# Patient Record
Sex: Female | Born: 2000 | Race: White | Hispanic: No | Marital: Single | Smoking: Never smoker
Health system: Southern US, Community
[De-identification: ages and names within clinical notes are randomized; demographics above are authoritative.]

---
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
## 2021-06-26 DIAGNOSIS — M62522 Muscle wasting and atrophy, not elsewhere classified, left upper arm: Secondary | ICD-10-CM | POA: Diagnosis not present

## 2021-06-26 DIAGNOSIS — M25512 Pain in left shoulder: Secondary | ICD-10-CM | POA: Diagnosis not present

## 2021-06-26 DIAGNOSIS — M4003 Postural kyphosis, cervicothoracic region: Secondary | ICD-10-CM | POA: Diagnosis not present

## 2021-07-01 DIAGNOSIS — H6693 Otitis media, unspecified, bilateral: Secondary | ICD-10-CM | POA: Diagnosis not present

## 2021-07-01 DIAGNOSIS — J029 Acute pharyngitis, unspecified: Secondary | ICD-10-CM | POA: Diagnosis not present

## 2021-07-02 DIAGNOSIS — M62522 Muscle wasting and atrophy, not elsewhere classified, left upper arm: Secondary | ICD-10-CM | POA: Diagnosis not present

## 2021-07-02 DIAGNOSIS — M4003 Postural kyphosis, cervicothoracic region: Secondary | ICD-10-CM | POA: Diagnosis not present

## 2021-07-02 DIAGNOSIS — M25512 Pain in left shoulder: Secondary | ICD-10-CM | POA: Diagnosis not present

## 2021-07-04 DIAGNOSIS — M62522 Muscle wasting and atrophy, not elsewhere classified, left upper arm: Secondary | ICD-10-CM | POA: Diagnosis not present

## 2021-07-04 DIAGNOSIS — M25512 Pain in left shoulder: Secondary | ICD-10-CM | POA: Diagnosis not present

## 2021-07-04 DIAGNOSIS — M4003 Postural kyphosis, cervicothoracic region: Secondary | ICD-10-CM | POA: Diagnosis not present

## 2021-07-09 DIAGNOSIS — M62522 Muscle wasting and atrophy, not elsewhere classified, left upper arm: Secondary | ICD-10-CM | POA: Diagnosis not present

## 2021-07-09 DIAGNOSIS — M4003 Postural kyphosis, cervicothoracic region: Secondary | ICD-10-CM | POA: Diagnosis not present

## 2021-07-09 DIAGNOSIS — M25512 Pain in left shoulder: Secondary | ICD-10-CM | POA: Diagnosis not present

## 2021-07-19 DIAGNOSIS — M25512 Pain in left shoulder: Secondary | ICD-10-CM | POA: Diagnosis not present

## 2021-07-19 DIAGNOSIS — M62522 Muscle wasting and atrophy, not elsewhere classified, left upper arm: Secondary | ICD-10-CM | POA: Diagnosis not present

## 2021-07-19 DIAGNOSIS — M4003 Postural kyphosis, cervicothoracic region: Secondary | ICD-10-CM | POA: Diagnosis not present

## 2021-07-30 DIAGNOSIS — M62522 Muscle wasting and atrophy, not elsewhere classified, left upper arm: Secondary | ICD-10-CM | POA: Diagnosis not present

## 2021-07-30 DIAGNOSIS — M25512 Pain in left shoulder: Secondary | ICD-10-CM | POA: Diagnosis not present

## 2021-07-30 DIAGNOSIS — M4003 Postural kyphosis, cervicothoracic region: Secondary | ICD-10-CM | POA: Diagnosis not present

## 2021-08-02 DIAGNOSIS — M25512 Pain in left shoulder: Secondary | ICD-10-CM | POA: Diagnosis not present

## 2021-08-02 DIAGNOSIS — M62522 Muscle wasting and atrophy, not elsewhere classified, left upper arm: Secondary | ICD-10-CM | POA: Diagnosis not present

## 2021-08-02 DIAGNOSIS — M4003 Postural kyphosis, cervicothoracic region: Secondary | ICD-10-CM | POA: Diagnosis not present

## 2021-08-06 DIAGNOSIS — M4003 Postural kyphosis, cervicothoracic region: Secondary | ICD-10-CM | POA: Diagnosis not present

## 2021-08-06 DIAGNOSIS — M25512 Pain in left shoulder: Secondary | ICD-10-CM | POA: Diagnosis not present

## 2021-08-06 DIAGNOSIS — M62522 Muscle wasting and atrophy, not elsewhere classified, left upper arm: Secondary | ICD-10-CM | POA: Diagnosis not present

## 2021-08-08 DIAGNOSIS — M62522 Muscle wasting and atrophy, not elsewhere classified, left upper arm: Secondary | ICD-10-CM | POA: Diagnosis not present

## 2021-08-08 DIAGNOSIS — M25512 Pain in left shoulder: Secondary | ICD-10-CM | POA: Diagnosis not present

## 2021-08-08 DIAGNOSIS — M4003 Postural kyphosis, cervicothoracic region: Secondary | ICD-10-CM | POA: Diagnosis not present

## 2021-08-13 DIAGNOSIS — M62522 Muscle wasting and atrophy, not elsewhere classified, left upper arm: Secondary | ICD-10-CM | POA: Diagnosis not present

## 2021-08-13 DIAGNOSIS — M4003 Postural kyphosis, cervicothoracic region: Secondary | ICD-10-CM | POA: Diagnosis not present

## 2021-08-13 DIAGNOSIS — M25512 Pain in left shoulder: Secondary | ICD-10-CM | POA: Diagnosis not present

## 2021-08-15 DIAGNOSIS — M62522 Muscle wasting and atrophy, not elsewhere classified, left upper arm: Secondary | ICD-10-CM | POA: Diagnosis not present

## 2021-08-15 DIAGNOSIS — M4003 Postural kyphosis, cervicothoracic region: Secondary | ICD-10-CM | POA: Diagnosis not present

## 2021-08-15 DIAGNOSIS — M25512 Pain in left shoulder: Secondary | ICD-10-CM | POA: Diagnosis not present

## 2021-08-19 DIAGNOSIS — M25512 Pain in left shoulder: Secondary | ICD-10-CM | POA: Diagnosis not present

## 2021-08-26 DIAGNOSIS — M25512 Pain in left shoulder: Secondary | ICD-10-CM | POA: Diagnosis not present

## 2021-09-20 DIAGNOSIS — N3090 Cystitis, unspecified without hematuria: Secondary | ICD-10-CM | POA: Diagnosis not present

## 2021-09-20 DIAGNOSIS — N760 Acute vaginitis: Secondary | ICD-10-CM | POA: Diagnosis not present

## 2021-09-24 DIAGNOSIS — M24112 Other articular cartilage disorders, left shoulder: Secondary | ICD-10-CM | POA: Diagnosis not present

## 2021-10-01 DIAGNOSIS — Z01818 Encounter for other preprocedural examination: Secondary | ICD-10-CM | POA: Diagnosis not present

## 2021-10-02 DIAGNOSIS — R509 Fever, unspecified: Secondary | ICD-10-CM | POA: Diagnosis not present

## 2021-10-02 DIAGNOSIS — M791 Myalgia, unspecified site: Secondary | ICD-10-CM | POA: Diagnosis not present

## 2021-10-30 DIAGNOSIS — Z113 Encounter for screening for infections with a predominantly sexual mode of transmission: Secondary | ICD-10-CM | POA: Diagnosis not present

## 2021-10-30 DIAGNOSIS — R102 Pelvic and perineal pain: Secondary | ICD-10-CM | POA: Diagnosis not present

## 2021-10-30 DIAGNOSIS — N83299 Other ovarian cyst, unspecified side: Secondary | ICD-10-CM | POA: Diagnosis not present

## 2021-11-13 DIAGNOSIS — N83202 Unspecified ovarian cyst, left side: Secondary | ICD-10-CM | POA: Diagnosis not present

## 2021-11-13 DIAGNOSIS — R102 Pelvic and perineal pain: Secondary | ICD-10-CM | POA: Diagnosis not present

## 2021-11-13 DIAGNOSIS — R1032 Left lower quadrant pain: Secondary | ICD-10-CM | POA: Diagnosis not present

## 2021-12-18 DIAGNOSIS — M24112 Other articular cartilage disorders, left shoulder: Secondary | ICD-10-CM | POA: Diagnosis not present

## 2021-12-18 DIAGNOSIS — M898X1 Other specified disorders of bone, shoulder: Secondary | ICD-10-CM | POA: Diagnosis not present

## 2021-12-18 DIAGNOSIS — J45909 Unspecified asthma, uncomplicated: Secondary | ICD-10-CM | POA: Diagnosis not present

## 2021-12-31 DIAGNOSIS — M25512 Pain in left shoulder: Secondary | ICD-10-CM | POA: Diagnosis not present

## 2021-12-31 DIAGNOSIS — M6281 Muscle weakness (generalized): Secondary | ICD-10-CM | POA: Diagnosis not present

## 2022-01-03 DIAGNOSIS — M6281 Muscle weakness (generalized): Secondary | ICD-10-CM | POA: Diagnosis not present

## 2022-01-03 DIAGNOSIS — M25512 Pain in left shoulder: Secondary | ICD-10-CM | POA: Diagnosis not present

## 2022-01-06 DIAGNOSIS — M25512 Pain in left shoulder: Secondary | ICD-10-CM | POA: Diagnosis not present

## 2022-01-06 DIAGNOSIS — M6281 Muscle weakness (generalized): Secondary | ICD-10-CM | POA: Diagnosis not present

## 2022-01-08 DIAGNOSIS — M25512 Pain in left shoulder: Secondary | ICD-10-CM | POA: Diagnosis not present

## 2022-01-08 DIAGNOSIS — M6281 Muscle weakness (generalized): Secondary | ICD-10-CM | POA: Diagnosis not present

## 2022-01-15 DIAGNOSIS — M25512 Pain in left shoulder: Secondary | ICD-10-CM | POA: Diagnosis not present

## 2022-01-15 DIAGNOSIS — M6281 Muscle weakness (generalized): Secondary | ICD-10-CM | POA: Diagnosis not present

## 2022-01-28 DIAGNOSIS — M25512 Pain in left shoulder: Secondary | ICD-10-CM | POA: Diagnosis not present

## 2022-01-28 DIAGNOSIS — M6281 Muscle weakness (generalized): Secondary | ICD-10-CM | POA: Diagnosis not present

## 2022-01-30 DIAGNOSIS — M6281 Muscle weakness (generalized): Secondary | ICD-10-CM | POA: Diagnosis not present

## 2022-01-30 DIAGNOSIS — M25512 Pain in left shoulder: Secondary | ICD-10-CM | POA: Diagnosis not present

## 2022-02-04 DIAGNOSIS — M6281 Muscle weakness (generalized): Secondary | ICD-10-CM | POA: Diagnosis not present

## 2022-02-04 DIAGNOSIS — M25512 Pain in left shoulder: Secondary | ICD-10-CM | POA: Diagnosis not present

## 2022-02-14 DIAGNOSIS — Z20822 Contact with and (suspected) exposure to covid-19: Secondary | ICD-10-CM | POA: Diagnosis not present

## 2022-02-14 DIAGNOSIS — J0100 Acute maxillary sinusitis, unspecified: Secondary | ICD-10-CM | POA: Diagnosis not present

## 2022-02-14 DIAGNOSIS — J45998 Other asthma: Secondary | ICD-10-CM | POA: Diagnosis not present

## 2022-03-06 DIAGNOSIS — H10413 Chronic giant papillary conjunctivitis, bilateral: Secondary | ICD-10-CM | POA: Diagnosis not present

## 2022-03-06 DIAGNOSIS — H5213 Myopia, bilateral: Secondary | ICD-10-CM | POA: Diagnosis not present

## 2022-03-18 DIAGNOSIS — M6281 Muscle weakness (generalized): Secondary | ICD-10-CM | POA: Diagnosis not present

## 2022-03-18 DIAGNOSIS — M25512 Pain in left shoulder: Secondary | ICD-10-CM | POA: Diagnosis not present

## 2022-03-20 DIAGNOSIS — M24112 Other articular cartilage disorders, left shoulder: Secondary | ICD-10-CM | POA: Diagnosis not present

## 2022-04-21 DIAGNOSIS — M24112 Other articular cartilage disorders, left shoulder: Secondary | ICD-10-CM | POA: Diagnosis not present

## 2022-05-12 DIAGNOSIS — Z4789 Encounter for other orthopedic aftercare: Secondary | ICD-10-CM | POA: Diagnosis not present

## 2022-05-12 DIAGNOSIS — M24112 Other articular cartilage disorders, left shoulder: Secondary | ICD-10-CM | POA: Diagnosis not present

## 2022-05-13 DIAGNOSIS — Z124 Encounter for screening for malignant neoplasm of cervix: Secondary | ICD-10-CM | POA: Diagnosis not present

## 2022-05-13 DIAGNOSIS — N760 Acute vaginitis: Secondary | ICD-10-CM | POA: Diagnosis not present

## 2022-05-13 DIAGNOSIS — Z01419 Encounter for gynecological examination (general) (routine) without abnormal findings: Secondary | ICD-10-CM | POA: Diagnosis not present

## 2022-05-13 DIAGNOSIS — Z113 Encounter for screening for infections with a predominantly sexual mode of transmission: Secondary | ICD-10-CM | POA: Diagnosis not present

## 2022-05-13 DIAGNOSIS — Z6821 Body mass index (BMI) 21.0-21.9, adult: Secondary | ICD-10-CM | POA: Diagnosis not present

## 2022-05-29 DIAGNOSIS — M24112 Other articular cartilage disorders, left shoulder: Secondary | ICD-10-CM | POA: Diagnosis not present

## 2022-06-05 DIAGNOSIS — R87612 Low grade squamous intraepithelial lesion on cytologic smear of cervix (LGSIL): Secondary | ICD-10-CM | POA: Diagnosis not present

## 2022-06-05 DIAGNOSIS — N72 Inflammatory disease of cervix uteri: Secondary | ICD-10-CM | POA: Diagnosis not present

## 2022-06-05 DIAGNOSIS — N871 Moderate cervical dysplasia: Secondary | ICD-10-CM | POA: Diagnosis not present

## 2022-06-05 DIAGNOSIS — N870 Mild cervical dysplasia: Secondary | ICD-10-CM | POA: Diagnosis not present

## 2022-07-01 DIAGNOSIS — N939 Abnormal uterine and vaginal bleeding, unspecified: Secondary | ICD-10-CM | POA: Diagnosis not present

## 2022-07-01 DIAGNOSIS — N72 Inflammatory disease of cervix uteri: Secondary | ICD-10-CM | POA: Diagnosis not present

## 2022-07-09 ENCOUNTER — Other Ambulatory Visit: Payer: Self-pay

## 2022-09-02 DIAGNOSIS — R87612 Low grade squamous intraepithelial lesion on cytologic smear of cervix (LGSIL): Secondary | ICD-10-CM | POA: Diagnosis not present

## 2022-09-04 DIAGNOSIS — R519 Headache, unspecified: Secondary | ICD-10-CM | POA: Diagnosis not present

## 2022-09-04 DIAGNOSIS — R070 Pain in throat: Secondary | ICD-10-CM | POA: Diagnosis not present

## 2022-09-04 DIAGNOSIS — J0100 Acute maxillary sinusitis, unspecified: Secondary | ICD-10-CM | POA: Diagnosis not present

## 2022-09-15 DIAGNOSIS — R87610 Atypical squamous cells of undetermined significance on cytologic smear of cervix (ASC-US): Secondary | ICD-10-CM | POA: Diagnosis not present

## 2022-09-15 DIAGNOSIS — R102 Pelvic and perineal pain: Secondary | ICD-10-CM | POA: Diagnosis not present

## 2022-12-09 DIAGNOSIS — R87810 Cervical high risk human papillomavirus (HPV) DNA test positive: Secondary | ICD-10-CM | POA: Diagnosis not present

## 2022-12-09 DIAGNOSIS — R87610 Atypical squamous cells of undetermined significance on cytologic smear of cervix (ASC-US): Secondary | ICD-10-CM | POA: Diagnosis not present

## 2023-03-23 DIAGNOSIS — H04123 Dry eye syndrome of bilateral lacrimal glands: Secondary | ICD-10-CM | POA: Diagnosis not present

## 2023-03-23 DIAGNOSIS — H10413 Chronic giant papillary conjunctivitis, bilateral: Secondary | ICD-10-CM | POA: Diagnosis not present

## 2023-03-23 DIAGNOSIS — H5213 Myopia, bilateral: Secondary | ICD-10-CM | POA: Diagnosis not present

## 2023-04-06 DIAGNOSIS — R070 Pain in throat: Secondary | ICD-10-CM | POA: Diagnosis not present

## 2023-04-06 DIAGNOSIS — R509 Fever, unspecified: Secondary | ICD-10-CM | POA: Diagnosis not present

## 2023-04-06 DIAGNOSIS — R0981 Nasal congestion: Secondary | ICD-10-CM | POA: Diagnosis not present

## 2023-04-06 DIAGNOSIS — R059 Cough, unspecified: Secondary | ICD-10-CM | POA: Diagnosis not present

## 2023-04-19 DIAGNOSIS — R42 Dizziness and giddiness: Secondary | ICD-10-CM | POA: Diagnosis not present

## 2023-04-19 DIAGNOSIS — R0981 Nasal congestion: Secondary | ICD-10-CM | POA: Diagnosis not present

## 2023-04-19 DIAGNOSIS — H6692 Otitis media, unspecified, left ear: Secondary | ICD-10-CM | POA: Diagnosis not present

## 2023-04-19 DIAGNOSIS — R051 Acute cough: Secondary | ICD-10-CM | POA: Diagnosis not present

## 2023-05-08 DIAGNOSIS — R140 Abdominal distension (gaseous): Secondary | ICD-10-CM | POA: Diagnosis not present

## 2023-05-08 DIAGNOSIS — L659 Nonscarring hair loss, unspecified: Secondary | ICD-10-CM | POA: Diagnosis not present

## 2023-05-08 DIAGNOSIS — R87612 Low grade squamous intraepithelial lesion on cytologic smear of cervix (LGSIL): Secondary | ICD-10-CM | POA: Diagnosis not present

## 2023-05-08 DIAGNOSIS — Z3041 Encounter for surveillance of contraceptive pills: Secondary | ICD-10-CM | POA: Diagnosis not present

## 2023-06-11 DIAGNOSIS — Z01419 Encounter for gynecological examination (general) (routine) without abnormal findings: Secondary | ICD-10-CM | POA: Diagnosis not present

## 2023-06-11 DIAGNOSIS — Z6823 Body mass index (BMI) 23.0-23.9, adult: Secondary | ICD-10-CM | POA: Diagnosis not present

## 2023-06-11 DIAGNOSIS — R87612 Low grade squamous intraepithelial lesion on cytologic smear of cervix (LGSIL): Secondary | ICD-10-CM | POA: Diagnosis not present

## 2023-06-11 DIAGNOSIS — Z113 Encounter for screening for infections with a predominantly sexual mode of transmission: Secondary | ICD-10-CM | POA: Diagnosis not present

## 2023-07-17 DIAGNOSIS — L659 Nonscarring hair loss, unspecified: Secondary | ICD-10-CM | POA: Diagnosis not present

## 2023-07-17 DIAGNOSIS — Z0000 Encounter for general adult medical examination without abnormal findings: Secondary | ICD-10-CM | POA: Diagnosis not present

## 2023-07-17 DIAGNOSIS — K589 Irritable bowel syndrome without diarrhea: Secondary | ICD-10-CM | POA: Diagnosis not present

## 2023-07-30 DIAGNOSIS — J029 Acute pharyngitis, unspecified: Secondary | ICD-10-CM | POA: Diagnosis not present

## 2023-12-15 DIAGNOSIS — Z124 Encounter for screening for malignant neoplasm of cervix: Secondary | ICD-10-CM | POA: Diagnosis not present

## 2023-12-15 DIAGNOSIS — R87612 Low grade squamous intraepithelial lesion on cytologic smear of cervix (LGSIL): Secondary | ICD-10-CM | POA: Diagnosis not present

## 2023-12-21 DIAGNOSIS — Z3202 Encounter for pregnancy test, result negative: Secondary | ICD-10-CM | POA: Diagnosis not present

## 2023-12-21 DIAGNOSIS — Z30430 Encounter for insertion of intrauterine contraceptive device: Secondary | ICD-10-CM | POA: Diagnosis not present

## 2023-12-30 ENCOUNTER — Other Ambulatory Visit: Payer: Self-pay

## 2023-12-30 ENCOUNTER — Emergency Department (HOSPITAL_BASED_OUTPATIENT_CLINIC_OR_DEPARTMENT_OTHER)

## 2023-12-30 ENCOUNTER — Emergency Department (HOSPITAL_BASED_OUTPATIENT_CLINIC_OR_DEPARTMENT_OTHER)
Admission: EM | Admit: 2023-12-30 | Discharge: 2023-12-31 | Disposition: A | Discharge status: Home / Self Care | Attending: Emergency Medicine | Admitting: Emergency Medicine

## 2023-12-30 DIAGNOSIS — R1032 Left lower quadrant pain: Secondary | ICD-10-CM | POA: Diagnosis not present

## 2023-12-30 DIAGNOSIS — Z975 Presence of (intrauterine) contraceptive device: Secondary | ICD-10-CM | POA: Diagnosis not present

## 2023-12-30 DIAGNOSIS — R1024 Suprapubic pain: Secondary | ICD-10-CM

## 2023-12-30 DIAGNOSIS — R1030 Lower abdominal pain, unspecified: Secondary | ICD-10-CM | POA: Diagnosis not present

## 2023-12-30 DIAGNOSIS — R102 Pelvic and perineal pain unspecified side: Secondary | ICD-10-CM | POA: Diagnosis not present

## 2023-12-30 LAB — COMPREHENSIVE METABOLIC PANEL WITH GFR
ALT: 13 U/L (ref 0–44)
AST: 16 U/L (ref 15–41)
Albumin: 4.8 g/dL (ref 3.5–5.0)
Alkaline Phosphatase: 115 U/L (ref 38–126)
Anion gap: 9 (ref 5–15)
BUN: 12 mg/dL (ref 6–20)
CO2: 27 mmol/L (ref 22–32)
Calcium: 9.8 mg/dL (ref 8.9–10.3)
Chloride: 101 mmol/L (ref 98–111)
Creatinine, Ser: 0.66 mg/dL (ref 0.44–1.00)
GFR, Estimated: >60 mL/min (ref >60)
Glucose, Bld: 88 mg/dL (ref 70–99)
Potassium: 3.7 mmol/L (ref 3.5–5.1)
Sodium: 138 mmol/L (ref 135–145)
Total Bilirubin: 0.5 mg/dL (ref 0.0–1.2)
Total Protein: 7.6 g/dL (ref 6.5–8.1)

## 2023-12-30 LAB — CBC WITH DIFFERENTIAL/PLATELET
Abs Immature Granulocytes: 0.01 K/uL (ref 0.00–0.07)
Basophils Absolute: 0 K/uL (ref 0.0–0.1)
Basophils Relative: 1 %
Eosinophils Absolute: 0.1 K/uL (ref 0.0–0.5)
Eosinophils Relative: 2 %
HCT: 38.7 % (ref 36.0–46.0)
Hemoglobin: 13.2 g/dL (ref 12.0–15.0)
Immature Granulocytes: 0 %
Lymphocytes Relative: 37 %
Lymphs Abs: 2.5 K/uL (ref 0.7–4.0)
MCH: 28.8 pg (ref 26.0–34.0)
MCHC: 34.1 g/dL (ref 30.0–36.0)
MCV: 84.3 fL (ref 80.0–100.0)
Monocytes Absolute: 0.6 K/uL (ref 0.1–1.0)
Monocytes Relative: 9 %
Neutro Abs: 3.4 K/uL (ref 1.7–7.7)
Neutrophils Relative %: 51 %
Platelets: 240 K/uL (ref 150–400)
RBC: 4.59 MIL/uL (ref 3.87–5.11)
RDW: 11.3 % — ABNORMAL LOW (ref 11.5–15.5)
WBC: 6.6 K/uL (ref 4.0–10.5)
nRBC: 0 % (ref 0.0–0.2)

## 2023-12-30 LAB — URINALYSIS, ROUTINE W REFLEX MICROSCOPIC
Bilirubin Urine: NEGATIVE
Glucose, UA: NEGATIVE mg/dL
Hgb urine dipstick: NEGATIVE
Ketones, ur: NEGATIVE mg/dL
Leukocytes,Ua: NEGATIVE
Nitrite: NEGATIVE
Protein, ur: NEGATIVE mg/dL
Specific Gravity, Urine: 1.017 (ref 1.005–1.030)
pH: 6.5 (ref 5.0–8.0)

## 2023-12-30 LAB — PREGNANCY, URINE: Preg Test, Ur: NEGATIVE

## 2023-12-30 MED ORDER — ONDANSETRON HCL 4 MG/2ML IJ SOLN
4.0000 mg | Freq: Once | INTRAMUSCULAR | Status: AC
  Administered 2023-12-30: 4 mg via INTRAVENOUS
  Filled 2023-12-30: qty 2

## 2023-12-30 MED ORDER — ONDANSETRON 4 MG PO TBDP: 4.0000 mg | ORAL_TABLET | Freq: Three times a day (TID) | ORAL | 0 refills | Status: AC | PRN

## 2023-12-30 MED ORDER — KETOROLAC TROMETHAMINE 30 MG/ML IJ SOLN
30.0000 mg | Freq: Once | INTRAMUSCULAR | Status: AC
  Administered 2023-12-30: 30 mg via INTRAVENOUS
  Filled 2023-12-30: qty 1

## 2023-12-30 NOTE — Discharge Instructions (Addendum)
 Today you were seen for suprapubic pain.  Your workup on the emergency department was reassuring.  You may alternate Tylenol/Motrin as needed for pain.  You have been prescribed Zofran  for nausea and vomiting.  Please follow-up with your OB/GYN a soon as possible for further evaluation and workup.  Please return to the ED if your pain becomes severe or you have uncontrollable nausea and vomiting.  Thank you for letting us  treat you today. After reviewing your labs and imaging, I feel you are safe to go home. Please follow up with your PCP in the next several days and provide them with your records from this visit. Return to the Emergency Room if pain becomes severe or symptoms worsen.

## 2023-12-30 NOTE — ED Triage Notes (Signed)
 Patient states IUD placed several weeks ago. Reports today she is having severe left sided pain that goes radiates down to her right side. Patient concerned about IUD placement.

## 2023-12-30 NOTE — ED Provider Notes (Signed)
 [place] [facility] AT [facility] Provider Note   CSN: [id number] Arrival date & time: 12/30/23  1730     Patient presents with: Groin Pain   Shannon Cowan is a 23 y.o. female presents today for left-sided suprapubic pain that radiates down into her left groin.  Patient had IUD placed approximately 1 week ago and reports her pain was initially cramping in nature but has since become left-sided with radiation and worsen.  Patient reports episodes of nausea and diaphoresis when pain gets severe.  Patient denies fever, chills, vomiting, vaginal discharge, bleeding, urinary symptoms, or possibility of STD.    Groin Pain       Prior to Admission medications   Medication Sig Start Date End Date Taking? Authorizing Provider  ondansetron  (ZOFRAN -ODT) 4 MG disintegrating tablet Take 1 tablet (4 mg total) by mouth every 8 (eight) hours as needed for nausea or vomiting. 12/30/23  Yes Dontae Minerva N, PA-C  gabapentin  (NEURONTIN ) 100 MG capsule 1 capsule twice daily for 1 week, 2 capsules twice daily for 1 week then 3 capsules twice daily PO 12/17/16   Corinthia Blossom, MD    Allergies: Patient has no known allergies.    Review of Systems  Genitourinary:  Positive for pelvic pain.    Updated Vital Signs BP 103/72 (BP Location: Right Arm)   Pulse 89   Temp 98.2 F (36.8 C) (Oral)   Resp 16   SpO2 100%   Physical Exam Vitals and nursing note reviewed.  Constitutional:      General: She is not in acute distress.    Appearance: She is well-developed. She is not toxic-appearing or diaphoretic.  HENT:     Head: Normocephalic and atraumatic.     Right Ear: External ear normal.     Left Ear: External ear normal.  Eyes:     Conjunctiva/sclera: Conjunctivae normal.  Cardiovascular:     Rate and Rhythm: Normal rate and regular rhythm.     Pulses: Normal pulses.     Heart sounds: Normal heart sounds. No murmur heard. Pulmonary:     Effort: Pulmonary effort is  normal. No respiratory distress.     Breath sounds: Normal breath sounds.  Abdominal:     General: There is no distension.     Palpations: Abdomen is soft.     Tenderness: There is abdominal tenderness in the suprapubic area. There is guarding. Negative signs include Murphy's sign, Rovsing's sign and McBurney's sign.  Musculoskeletal:        General: No swelling.     Cervical back: Neck supple.  Skin:    General: Skin is warm and dry.     Capillary Refill: Capillary refill takes less than 2 seconds.  Neurological:     General: No focal deficit present.     Mental Status: She is alert and oriented to person, place, and time.  Psychiatric:        Mood and Affect: Mood normal.     (all labs ordered are listed, but only abnormal results are displayed) Labs Reviewed  CBC WITH DIFFERENTIAL/PLATELET - Abnormal; Notable for the following components:      Result Value   RDW 11.3 (*)    All other components within normal limits  URINALYSIS, ROUTINE W REFLEX MICROSCOPIC  PREGNANCY, URINE  COMPREHENSIVE METABOLIC PANEL WITH GFR    EKG: None  Radiology: US  PELVIC COMPLETE W TRANSVAGINAL AND TORSION R/O Result Date: 12/30/2023 CLINICAL DATA:  Pelvic pain EXAM: TRANSABDOMINAL AND TRANSVAGINAL  ULTRASOUND OF PELVIS DOPPLER ULTRASOUND OF OVARIES TECHNIQUE: Both transabdominal and transvaginal ultrasound examinations of the pelvis were performed. Transabdominal technique was performed for global imaging of the pelvis including uterus, ovaries, adnexal regions, and pelvic cul-de-sac. It was necessary to proceed with endovaginal exam following the transabdominal exam to visualize the ovaries and endometrium. Color and duplex Doppler ultrasound was utilized to evaluate blood flow to the ovaries. COMPARISON:  None Available. FINDINGS: Uterus: Measurements: 6.6 x 2.2 x 4.3 cm = volume: 32 mL. No fibroids or other mass visualized. Endometrium Thickness: 3.7 mm. No focal abnormality visualized. There is  an IUD identified in the body and fundal endometrium. Right ovary Measurements: 4.2 x 2.7 x 2.6 cm = volume: 15 mL. Normal appearance/no adnexal mass. Left ovary Measurements: 3.7 x 3.4 x 2.3 cm = volume: 16 mL. There is a dominant follicle or small cyst measuring 2.6 cm in the left ovary. No adnexal mass. Pulsed Doppler evaluation of both ovaries demonstrates normal low-resistance arterial and venous waveforms. Other: No free fluid. IMPRESSION: 1. No evidence of ovarian torsion. 2. IUD in place. 3. 2.6 cm left ovarian follicle, normal finding. Electronically Signed   By: Greig Pique M.D.   On: 12/30/2023 23:06     Procedures   Medications Ordered in the ED  ketorolac (TORADOL) 30 MG/ML injection 30 mg (30 mg Intravenous Given 12/30/23 2045)  ondansetron  (ZOFRAN ) injection 4 mg (4 mg Intravenous Given 12/30/23 2044)                                    Medical Decision Making Amount and/or Complexity of Data Reviewed Labs: ordered. Radiology: ordered.  Risk Prescription drug management.   This patient presents to the ED for concern of suprapubic pain differential diagnosis includes UTI, STD, ovarian torsion, misplaced IUD, ovarian cyst    Additional history obtained   Additional history obtained from Electronic Medical Record External records from outside source obtained and reviewed including Care Everywhere   Lab Tests:  I Ordered, and personally interpreted labs.  The pertinent results include: CBC unremarkable, UA WNL, negative pregnancy, CMP unremarkable   Imaging Studies ordered:  I ordered imaging studies including ultrasound torsion rule out I independently visualized and interpreted imaging which showed no evidence of ovarian torsion.  IUD in place.  2.6 cm left ovarian follicle, normal finding. I agree with the radiologist interpretation   Medicines ordered and prescription drug management:  I ordered medication including Toradol and Zofran     I have  reviewed the patients home medicines and have made adjustments as needed   Problem List / ED Course:  Considered for admission or further workup however patient's vital signs, physical exam, labs, and imaging are reassuring.  Patient symptoms have improved after Toradol and Zofran .  Patient advised to alternate Tylenol Motrin and given antiemetic for nausea and vomiting.  Patient to follow-up with primary care/OB/GYN for further evaluation workup.  Patient given return precautions.  I feel patient safe for discharge at this time.      Final diagnoses:  Suprapubic pain    ED Discharge Orders          Ordered    ondansetron  (ZOFRAN -ODT) 4 MG disintegrating tablet  Every 8 hours PRN        12/30/23 2344               Francis Ileana SAILOR, PA-C 12/30/23 2346  Dreama Longs, MD 12/31/23 1515

## 2023-12-31 DIAGNOSIS — R1032 Left lower quadrant pain: Secondary | ICD-10-CM | POA: Diagnosis not present

## 2023-12-31 DIAGNOSIS — Z30431 Encounter for routine checking of intrauterine contraceptive device: Secondary | ICD-10-CM | POA: Diagnosis not present

## 2023-12-31 DIAGNOSIS — R102 Pelvic and perineal pain unspecified side: Secondary | ICD-10-CM | POA: Diagnosis not present

## 2024-01-19 DIAGNOSIS — R1032 Left lower quadrant pain: Secondary | ICD-10-CM | POA: Diagnosis not present

## 2024-01-25 ENCOUNTER — Encounter: Payer: Self-pay | Admitting: Gastroenterology

## 2024-02-01 ENCOUNTER — Ambulatory Visit: Admitting: Gastroenterology

## 2024-02-01 NOTE — Progress Notes (Deleted)
 Shannon Cowan [id number] 08-Aug-2000   Chief Complaint:  Referring Provider: Marget Lenis, MD Primary GI MD: Sampson  HPI: Shannon Cowan is a 23 y.o. female with past medical history of *** who presents today for a complaint of *** .    Patient is referred by OB/GYN for pelvic and perineal pain.  Per note, has had LLQ pain, distended colon on the left.  Normal gynecologic ultrasound and exam.  Labs 12/30/2023: Normal CBC, CMP, UA, negative pregnancy test  Discussed the use of AI scribe software for clinical note transcription with the patient, who gave verbal consent to proceed.  History of Present Illness       Previous GI Procedures/Imaging   Pelvic ultrasound 12/30/2023 IMPRESSION: 1. No evidence of ovarian torsion. 2. IUD in place. 3. 2.6 cm left ovarian follicle, normal finding.  No past medical history on file.  No past surgical history on file.  Current Outpatient Medications  Medication Sig Dispense Refill   gabapentin  (NEURONTIN ) 100 MG capsule 1 capsule twice daily for 1 week, 2 capsules twice daily for 1 week then 3 capsules twice daily PO 180 capsule 2   ondansetron  (ZOFRAN -ODT) 4 MG disintegrating tablet Take 1 tablet (4 mg total) by mouth every 8 (eight) hours as needed for nausea or vomiting. 20 tablet 0   No current facility-administered medications for this visit.    Allergies as of 02/01/2024   (No Known Allergies)    Family History  Problem Relation Age of Onset   Multiple sclerosis Neg Hx     Social History[1]   Review of Systems:    Constitutional: No weight loss, fever, chills, weakness or fatigue Eyes: No change in vision Ears, Nose, Throat:  No change in hearing or congestion Skin: No rash or itching Cardiovascular: No chest pain, chest pressure or palpitations   Respiratory: No SOB or cough Gastrointestinal: See HPI and otherwise negative Genitourinary: No dysuria or change in urinary frequency Neurological: No headache,  dizziness or syncope Musculoskeletal: No new muscle or joint pain Hematologic: No bleeding or bruising    Physical Exam:  Vital signs: There were no vitals taken for this visit.  Constitutional: NAD, Well developed, Well nourished, alert and cooperative Head:  Normocephalic and atraumatic.  Eyes: No scleral icterus. Conjunctiva pink. Mouth: No oral lesions. Respiratory: Respirations even and unlabored. Lungs clear to auscultation bilaterally.  No wheezes, crackles, or rhonchi.  Cardiovascular:  Regular rate and rhythm. No murmurs. No peripheral edema. Gastrointestinal:  Soft, nondistended, nontender. No rebound or guarding. Normal bowel sounds. No appreciable masses or hepatomegaly. Rectal:  Not performed.  Neurologic:  Alert and oriented x4;  grossly normal neurologically.  Skin:   Dry and intact without significant lesions or rashes. Psychiatric: Oriented to person, place and time. Demonstrates good judgement and reason without abnormal affect or behaviors.   RELEVANT LABS AND IMAGING: CBC    Component Value Date/Time   WBC 6.6 12/30/2023 2045   RBC 4.59 12/30/2023 2045   HGB 13.2 12/30/2023 2045   HCT 38.7 12/30/2023 2045   PLT 240 12/30/2023 2045   MCV 84.3 12/30/2023 2045   MCH 28.8 12/30/2023 2045   MCHC 34.1 12/30/2023 2045   RDW 11.3 (L) 12/30/2023 2045   LYMPHSABS 2.5 12/30/2023 2045   MONOABS 0.6 12/30/2023 2045   EOSABS 0.1 12/30/2023 2045   BASOSABS 0.0 12/30/2023 2045    CMP     Component Value Date/Time   NA 138 12/30/2023 2045   K 3.7  12/30/2023 2045   CL 101 12/30/2023 2045   CO2 27 12/30/2023 2045   GLUCOSE 88 12/30/2023 2045   BUN 12 12/30/2023 2045   CREATININE 0.66 12/30/2023 2045   CALCIUM 9.8 12/30/2023 2045   PROT 7.6 12/30/2023 2045   ALBUMIN 4.8 12/30/2023 2045   AST 16 12/30/2023 2045   ALT 13 12/30/2023 2045   ALKPHOS 115 12/30/2023 2045   BILITOT 0.5 12/30/2023 2045   GFRNONAA >60 12/30/2023 2045     Assessment/Plan:     Assessment and Plan Assessment & Plan        Camie Furbish, PA-C [facility] 02/01/2024, 9:24 AM  Patient Care Team: Melodye Lenis, MD (Inactive) as PCP - General (Pediatrics) Corinthia Blossom, MD as Consulting Physician (Pediatric Neurology) Josefina Chew, MD as Consulting Physician (Orthopedic Surgery)      [1]  Social History Tobacco Use   Smoking status: Never   Smokeless tobacco: Never

## 2024-03-16 ENCOUNTER — Encounter: Payer: Self-pay | Admitting: Gastroenterology

## 2024-04-08 ENCOUNTER — Ambulatory Visit: Admitting: Gastroenterology
